# Patient Record
Sex: Female | Born: 1975 | Race: White | Hispanic: No | Marital: Single | Smoking: Current every day smoker
Health system: Southern US, Community
[De-identification: ages and names within clinical notes are randomized; demographics above are authoritative.]

## PROBLEM LIST (undated history)

## (undated) DIAGNOSIS — I10 Essential (primary) hypertension: Secondary | ICD-10-CM

## (undated) HISTORY — PX: TUBAL LIGATION: SHX77

---

## 2014-09-02 ENCOUNTER — Emergency Department (HOSPITAL_COMMUNITY)
Admission: EM | Admit: 2014-09-02 | Discharge: 2014-09-02 | Disposition: A | Discharge status: Home / Self Care | Payer: Self-pay | Attending: Emergency Medicine | Admitting: Emergency Medicine

## 2014-09-02 ENCOUNTER — Emergency Department (HOSPITAL_COMMUNITY): Payer: Self-pay

## 2014-09-02 ENCOUNTER — Encounter (HOSPITAL_COMMUNITY): Payer: Self-pay

## 2014-09-02 DIAGNOSIS — Z720 Tobacco use: Secondary | ICD-10-CM | POA: Insufficient documentation

## 2014-09-02 DIAGNOSIS — R079 Chest pain, unspecified: Secondary | ICD-10-CM | POA: Insufficient documentation

## 2014-09-02 DIAGNOSIS — Z79899 Other long term (current) drug therapy: Secondary | ICD-10-CM | POA: Insufficient documentation

## 2014-09-02 DIAGNOSIS — Z880 Allergy status to penicillin: Secondary | ICD-10-CM | POA: Insufficient documentation

## 2014-09-02 DIAGNOSIS — I10 Essential (primary) hypertension: Secondary | ICD-10-CM | POA: Insufficient documentation

## 2014-09-02 HISTORY — DX: Essential (primary) hypertension: I10

## 2014-09-02 LAB — BASIC METABOLIC PANEL
Anion gap: 13 (ref 5–15)
BUN: 11 mg/dL (ref 6–23)
CO2: 21 mEq/L (ref 19–32)
Calcium: 8.9 mg/dL (ref 8.4–10.5)
Chloride: 105 mEq/L (ref 96–112)
Creatinine, Ser: 1.05 mg/dL (ref 0.50–1.10)
GFR, EST AFRICAN AMERICAN: 77 mL/min — AB (ref >90)
GFR, EST NON AFRICAN AMERICAN: 66 mL/min — AB (ref >90)
Glucose, Bld: 102 mg/dL — ABNORMAL HIGH (ref 70–99)
POTASSIUM: 4.3 meq/L (ref 3.7–5.3)
Sodium: 139 mEq/L (ref 137–147)

## 2014-09-02 LAB — D-DIMER, QUANTITATIVE: D-Dimer, Quant: <0.27 ug/mL-FEU (ref 0.00–0.48)

## 2014-09-02 LAB — I-STAT TROPONIN, ED: TROPONIN I, POC: 0 ng/mL (ref 0.00–0.08)

## 2014-09-02 LAB — CBC
HCT: 44.9 % (ref 36.0–46.0)
Hemoglobin: 14.9 g/dL (ref 12.0–15.0)
MCH: 31 pg (ref 26.0–34.0)
MCHC: 33.2 g/dL (ref 30.0–36.0)
MCV: 93.5 fL (ref 78.0–100.0)
PLATELETS: 288 10*3/uL (ref 150–400)
RBC: 4.8 MIL/uL (ref 3.87–5.11)
RDW: 13.6 % (ref 11.5–15.5)
WBC: 7.6 10*3/uL (ref 4.0–10.5)

## 2014-09-02 MED ORDER — CYCLOBENZAPRINE HCL 10 MG PO TABS: 10.0000 mg | ORAL_TABLET | Freq: Two times a day (BID) | ORAL | Status: AC | PRN

## 2014-09-02 MED ORDER — GI COCKTAIL ~~LOC~~
30.0000 mL | Freq: Once | ORAL | Status: AC
  Administered 2014-09-02: 30 mL via ORAL
  Filled 2014-09-02: qty 30

## 2014-09-02 MED ORDER — OMEPRAZOLE 20 MG PO CPDR: DELAYED_RELEASE_CAPSULE | ORAL | Status: AC
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
## 2014-09-02 NOTE — Discharge Instructions (Signed)
Please read and follow all provided instructions.  Your diagnoses today include:  1. Chest pain, unspecified chest pain type   2. Chest pain     Tests performed today include:  An EKG of your heart  A chest x-ray  Cardiac enzymes - a blood test for heart muscle damage  D-dimer - test for blood clot in lungs  Blood counts and electrolytes  Vital signs. See below for your results today.   Medications prescribed:   Omeprazole (Prilosec) - stomach acid reducer  This medication can be found over-the-counter   Flexeril (cyclobenzaprine) - muscle relaxer medication  DO NOT drive or perform any activities that require you to be awake and alert because this medicine can make you drowsy. ;e Take any prescribed medications only as directed.  Follow-up instructions: Please follow-up with your primary care provider as soon as you can for further evaluation of your symptoms.   Return instructions:  SEEK IMMEDIATE MEDICAL ATTENTION IF:  You have severe chest pain, especially if the pain is crushing or pressure-like and spreads to the arms, back, neck, or jaw, or if you have sweating, nausea (feeling sick to your stomach), or shortness of breath. THIS IS AN EMERGENCY. Don't wait to see if the pain will go away. Get medical help at once. Call 911 or [phone] (operator). DO NOT drive yourself to the hospital.   Your chest pain gets worse and does not go away with rest.   You have an attack of chest pain lasting longer than usual, despite rest and treatment with the medications your caregiver has prescribed.   You wake from sleep with chest pain or shortness of breath.  You feel dizzy or faint.  You have chest pain not typical of your usual pain for which you originally saw your caregiver.   You have any other emergent concerns regarding your health.  Additional Information: Chest pain comes from many different causes. Your caregiver has diagnosed you as having chest pain that is not  specific for one problem, but does not require admission.  You are at low risk for an acute heart condition or other serious illness.   Your vital signs today were: BP 118/69 mmHg   Pulse 65   Temp(Src) 97.7 F (36.5 C) (Oral)   Resp 19   Ht 5\' 4"  (1.626 m)   Wt 140 lb (63.504 kg)   BMI 24.02 kg/m2   SpO2 98%   LMP 08/18/2014 If your blood pressure (BP) was elevated above 135/85 this visit, please have this repeated by your doctor within one month. --------------

## 2014-09-02 NOTE — ED Notes (Signed)
Clarified with Josh, PA-C. Patient does not need repeat I-stat troponin, patient reports GI cocktail was effective for pain.
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
## 2015-10-18 IMAGING — CR DG CHEST 2V
2 series · 2 of 2 positions shown · non-contrast
Comparison: None.

CLINICAL DATA: 38-year-old female with left side chest pain
heaviness and shortness of breath for 3 days. Current history of
smoking. Initial encounter.

EXAM:
CHEST  2 VIEW

[chest pa]
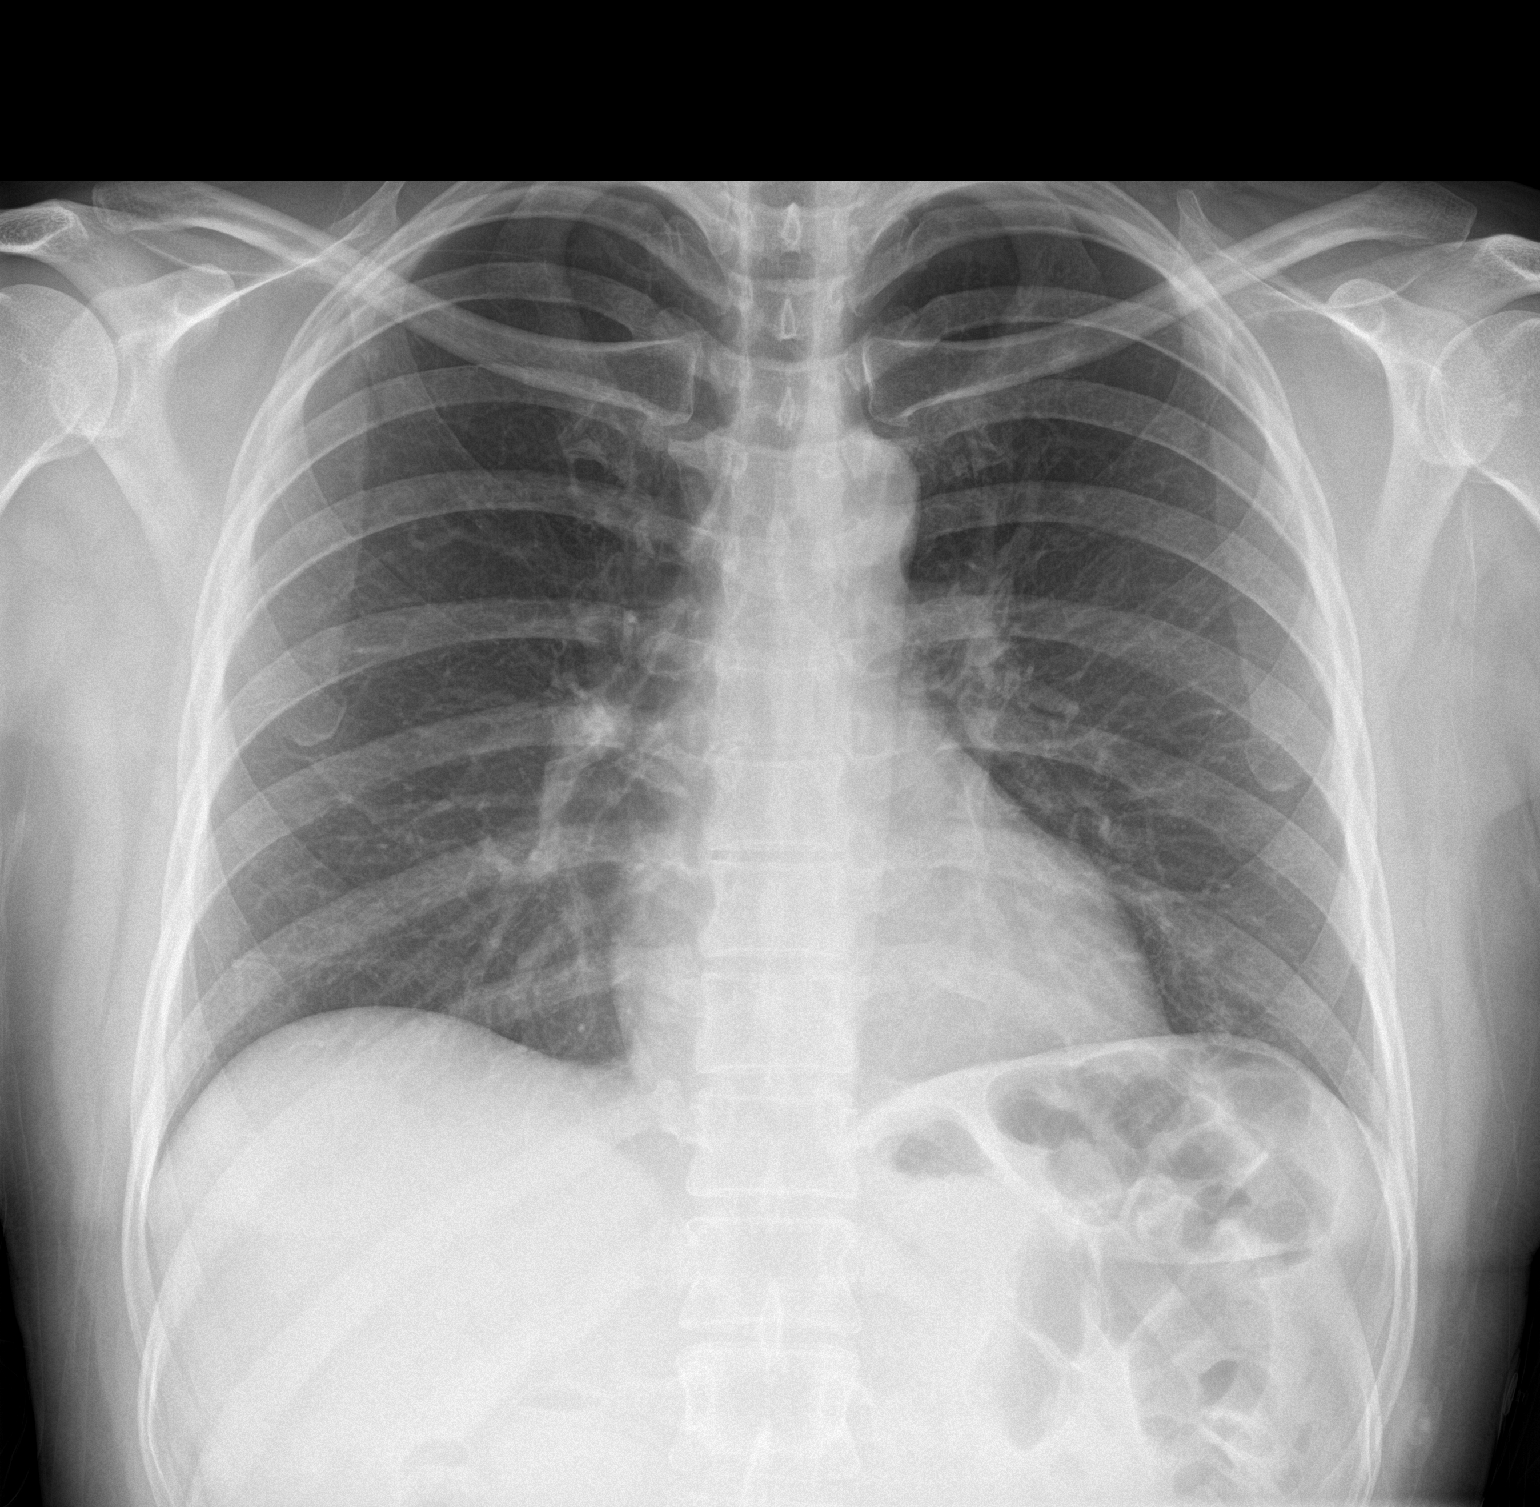

[chest lat]
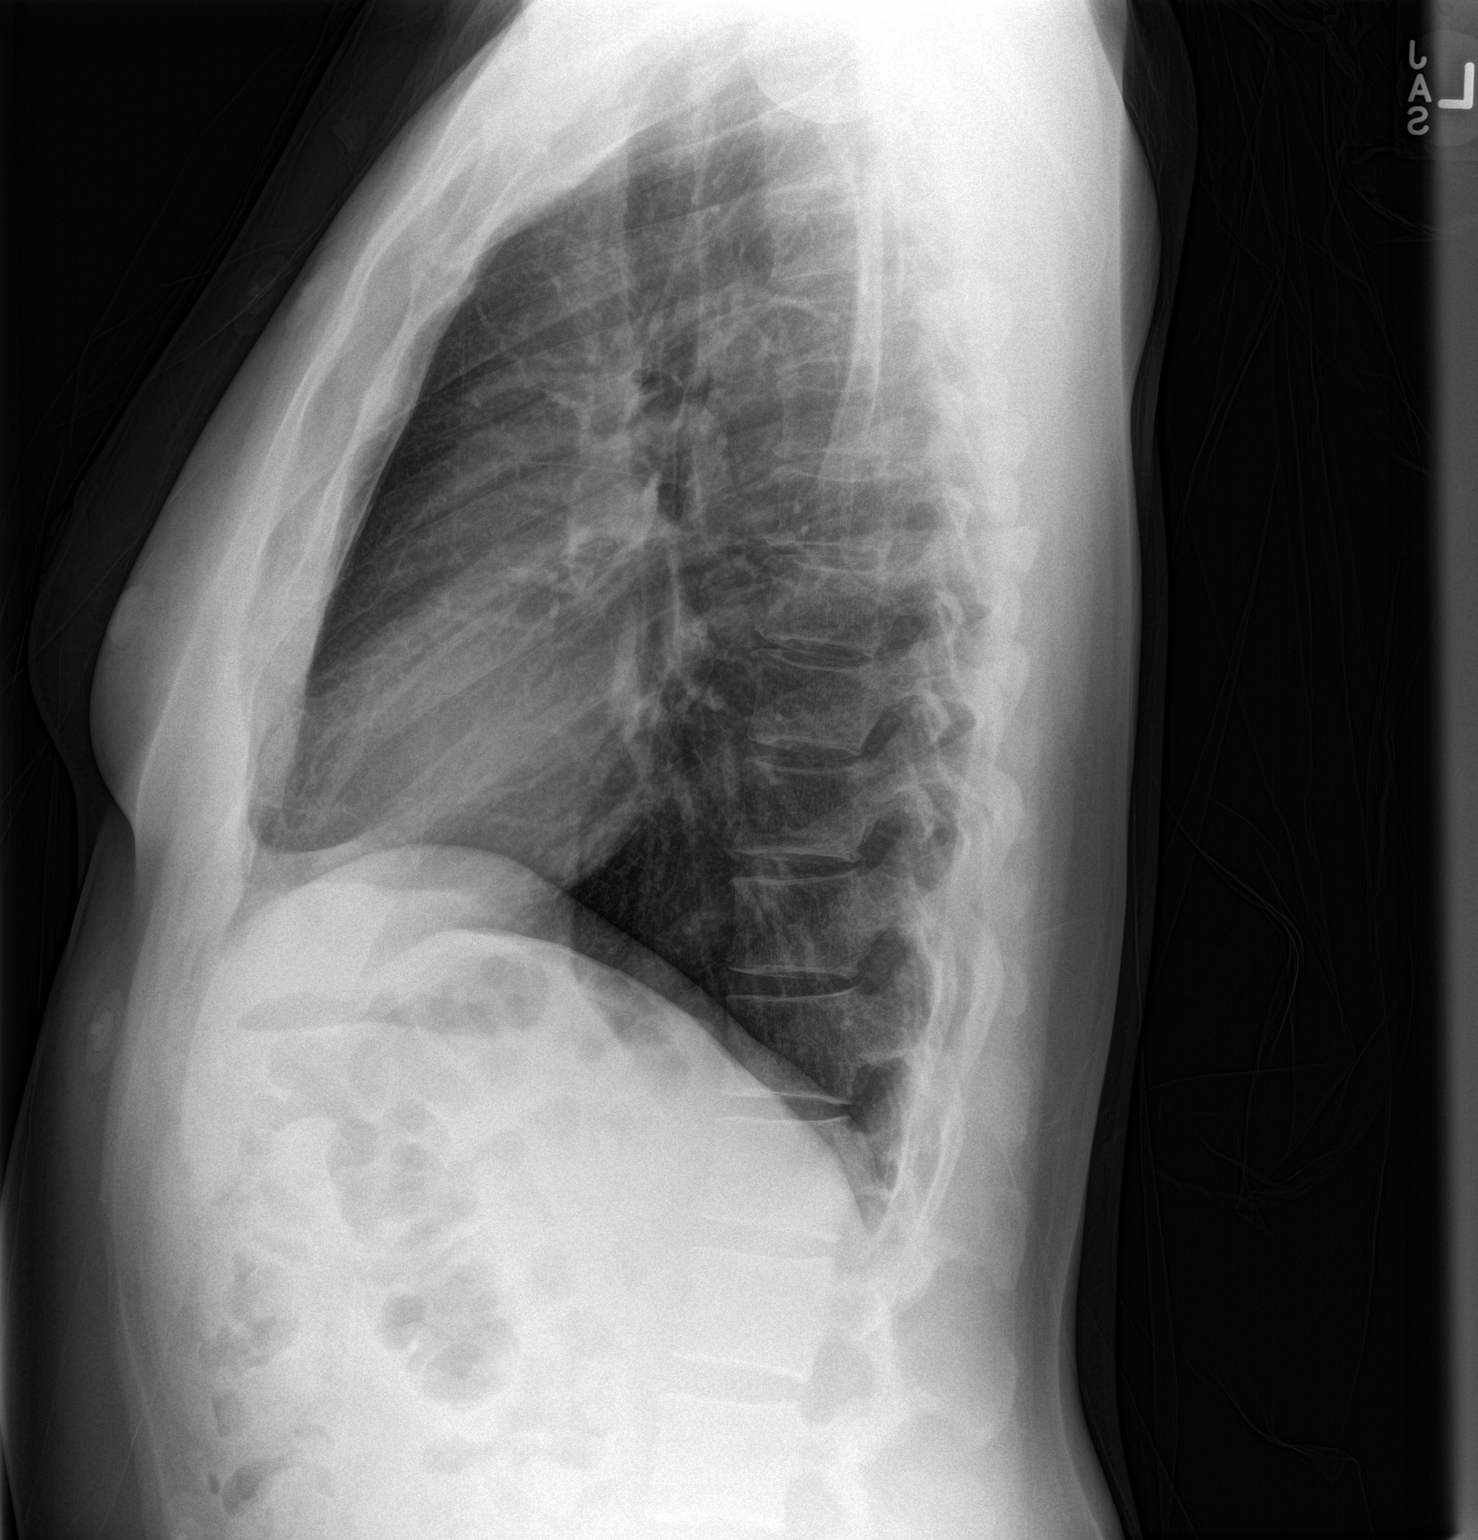

[2 of 2 positions shown; findings below may reference images not displayed]

FINDINGS: Normal lung volumes. Normal cardiac size and mediastinal contours.
Visualized tracheal air column is within normal limits. The lungs
are clear. No pneumothorax or effusion. No osseous abnormality
identified.
IMPRESSION: Negative, no acute cardiopulmonary abnormality.
